# Patient Record
Sex: Male | Born: 1975 | Race: Asian | Hispanic: No | Marital: Married | Smoking: Never smoker
Health system: Southern US, Community
[De-identification: ages and names within clinical notes are randomized; demographics above are authoritative.]

## PROBLEM LIST (undated history)

## (undated) DIAGNOSIS — K219 Gastro-esophageal reflux disease without esophagitis: Secondary | ICD-10-CM

## (undated) HISTORY — PX: OTHER SURGICAL HISTORY: SHX169

## (undated) HISTORY — DX: Gastro-esophageal reflux disease without esophagitis: K21.9

---

## 2011-11-06 ENCOUNTER — Encounter: Payer: Self-pay | Admitting: Cardiology

## 2011-11-12 ENCOUNTER — Encounter: Payer: Self-pay | Admitting: *Deleted

## 2011-11-12 ENCOUNTER — Encounter: Payer: Self-pay | Admitting: Cardiology

## 2011-11-13 ENCOUNTER — Ambulatory Visit (INDEPENDENT_AMBULATORY_CARE_PROVIDER_SITE_OTHER): Payer: BC Managed Care – PPO | Admitting: Cardiology

## 2011-11-13 ENCOUNTER — Encounter: Payer: Self-pay | Admitting: Cardiology

## 2011-11-13 DIAGNOSIS — K219 Gastro-esophageal reflux disease without esophagitis: Secondary | ICD-10-CM | POA: Insufficient documentation

## 2011-11-13 DIAGNOSIS — R079 Chest pain, unspecified: Secondary | ICD-10-CM | POA: Insufficient documentation

## 2011-11-13 NOTE — Assessment & Plan Note (Signed)
Prilosec initiated yesterday. Will continue.

## 2011-11-13 NOTE — Progress Notes (Signed)
HPI: 35 yo male with no prior cardiac history for evaluation of chest pain. For the past 5 months has had occasional epigastric and chest pain. The pain is not pleuritic, positional or exertional. It occurs after eating. It lasts 30 minutes and resolves spontaneously. It does not radiate. No associated symptoms. Patient otherwise denies dyspnea on exertion, orthopnea, PND, pedal edema, syncope or exertional chest pain. Patient was started on Prilosec yesterday. Cardiology is asked to further evaluate. Pain is described as a needle sensation.  Current Outpatient Prescriptions  Medication Sig Dispense Refill  . omeprazole (PRILOSEC) 20 MG capsule Take 1 tablet by mouth Daily.      Marland Kitchen PREVPAC combo pack as directed.      . Vitamin D, Ergocalciferol, (DRISDOL) 50000 UNITS CAPS Take 1 tablet by mouth every 7 (seven) days.         No Known Allergies  Past Medical History  Diagnosis Date  . Chest pain   . Heart burn     No past surgical history on file.  History   Social History  . Marital Status: Married    Spouse Name: N/A    Number of Children: N/A  . Years of Education: N/A   Occupational History  . Not on file.   Social History Main Topics  . Smoking status: Never Smoker   . Smokeless tobacco: Not on file  . Alcohol Use: Not on file  . Drug Use: Not on file  . Sexually Active: Not on file   Other Topics Concern  . Not on file   Social History Narrative  . No narrative on file    No family history on file.  ROS: no fevers or chills, productive cough, hemoptysis, dysphasia, odynophagia, melena, hematochezia, dysuria, hematuria, rash, seizure activity, orthopnea, PND, pedal edema, claudication. Remaining systems are negative.  Physical Exam:   Blood pressure 99/62, pulse 75, weight 185 lb (83.915 kg).  General:  Well developed/well nourished in NAD Skin warm/dry Patient not depressed No peripheral clubbing Back-normal HEENT-normal/normal eyelids Neck supple/normal  carotid upstroke bilaterally; no bruits; no JVD; no thyromegaly chest - CTA/ normal expansion CV - RRR/normal S1 and S2; no murmurs, rubs or gallops;  PMI nondisplaced Abdomen -NT/ND, no HSM, no mass, + bowel sounds, no bruit 2+ femoral pulses, no bruits Ext-no edema, chords, 2+ DP Neuro-grossly nonfocal  ECG  Normal sinus rhythm with no ST changes.

## 2011-11-13 NOTE — Patient Instructions (Signed)
Your physician recommends that you schedule a follow-up appointment in: AS NEEDED  Your physician recommends that you continue on your current medications as directed. Please refer to the Current Medication list given to you today.  

## 2011-11-13 NOTE — Assessment & Plan Note (Signed)
Patient's chest pain is most consistent with GI etiology. It occurs only after eating. It is not exertional. Continue Prilosec. If symptoms persist will consider treadmill early future.

## 2011-11-27 ENCOUNTER — Ambulatory Visit
Admission: RE | Admit: 2011-11-27 | Discharge: 2011-11-27 | Disposition: A | Discharge status: Home / Self Care | Payer: BC Managed Care – PPO | Source: Ambulatory Visit | Attending: Allergy | Admitting: Allergy

## 2011-11-27 ENCOUNTER — Other Ambulatory Visit: Payer: Self-pay | Admitting: Allergy

## 2011-11-27 DIAGNOSIS — R05 Cough: Secondary | ICD-10-CM

## 2014-01-27 ENCOUNTER — Emergency Department (HOSPITAL_COMMUNITY): Payer: BC Managed Care – PPO

## 2014-01-27 ENCOUNTER — Emergency Department (HOSPITAL_COMMUNITY)
Admission: EM | Admit: 2014-01-27 | Discharge: 2014-01-27 | Disposition: A | Discharge status: Home / Self Care | Payer: BC Managed Care – PPO | Attending: Emergency Medicine | Admitting: Emergency Medicine

## 2014-01-27 ENCOUNTER — Encounter (HOSPITAL_COMMUNITY): Payer: Self-pay | Admitting: Emergency Medicine

## 2014-01-27 DIAGNOSIS — Z8719 Personal history of other diseases of the digestive system: Secondary | ICD-10-CM | POA: Insufficient documentation

## 2014-01-27 DIAGNOSIS — R52 Pain, unspecified: Secondary | ICD-10-CM | POA: Insufficient documentation

## 2014-01-27 DIAGNOSIS — R109 Unspecified abdominal pain: Secondary | ICD-10-CM

## 2014-01-27 DIAGNOSIS — R1031 Right lower quadrant pain: Secondary | ICD-10-CM | POA: Insufficient documentation

## 2014-01-27 LAB — CBC WITH DIFFERENTIAL/PLATELET
BASOS PCT: 0 % (ref 0–1)
Basophils Absolute: 0 10*3/uL (ref 0.0–0.1)
EOS ABS: 0.1 10*3/uL (ref 0.0–0.7)
Eosinophils Relative: 1 % (ref 0–5)
HEMATOCRIT: 41.2 % (ref 39.0–52.0)
HEMOGLOBIN: 14.4 g/dL (ref 13.0–17.0)
Lymphocytes Relative: 36 % (ref 12–46)
Lymphs Abs: 1.7 10*3/uL (ref 0.7–4.0)
MCH: 32.3 pg (ref 26.0–34.0)
MCHC: 35 g/dL (ref 30.0–36.0)
MCV: 92.4 fL (ref 78.0–100.0)
MONO ABS: 0.4 10*3/uL (ref 0.1–1.0)
MONOS PCT: 8 % (ref 3–12)
NEUTROS PCT: 55 % (ref 43–77)
Neutro Abs: 2.6 10*3/uL (ref 1.7–7.7)
Platelets: 202 10*3/uL (ref 150–400)
RBC: 4.46 MIL/uL (ref 4.22–5.81)
RDW: 11.7 % (ref 11.5–15.5)
WBC: 4.7 10*3/uL (ref 4.0–10.5)

## 2014-01-27 LAB — COMPREHENSIVE METABOLIC PANEL
ALBUMIN: 4 g/dL (ref 3.5–5.2)
ALT: 15 U/L (ref 0–53)
AST: 18 U/L (ref 0–37)
Alkaline Phosphatase: 87 U/L (ref 39–117)
BUN: 14 mg/dL (ref 6–23)
CALCIUM: 9.1 mg/dL (ref 8.4–10.5)
CO2: 30 mEq/L (ref 19–32)
CREATININE: 1.03 mg/dL (ref 0.50–1.35)
Chloride: 102 mEq/L (ref 96–112)
GFR calc Af Amer: >90 mL/min (ref >90)
GFR calc non Af Amer: 89 mL/min — ABNORMAL LOW (ref >90)
Glucose, Bld: 101 mg/dL — ABNORMAL HIGH (ref 70–99)
Potassium: 3.9 mEq/L (ref 3.7–5.3)
Sodium: 141 mEq/L (ref 137–147)
TOTAL PROTEIN: 7.5 g/dL (ref 6.0–8.3)
Total Bilirubin: 0.6 mg/dL (ref 0.3–1.2)

## 2014-01-27 LAB — URINALYSIS, ROUTINE W REFLEX MICROSCOPIC
Bilirubin Urine: NEGATIVE
GLUCOSE, UA: NEGATIVE mg/dL
Hgb urine dipstick: NEGATIVE
Ketones, ur: NEGATIVE mg/dL
LEUKOCYTES UA: NEGATIVE
Nitrite: NEGATIVE
PH: 5.5 (ref 5.0–8.0)
PROTEIN: NEGATIVE mg/dL
Specific Gravity, Urine: 1.025 (ref 1.005–1.030)
Urobilinogen, UA: 0.2 mg/dL (ref 0.0–1.0)

## 2014-01-27 LAB — LIPASE, BLOOD: Lipase: 45 U/L (ref 11–59)

## 2014-01-27 MED ORDER — SODIUM CHLORIDE 0.9 % IV BOLUS (SEPSIS)
1000.0000 mL | Freq: Once | INTRAVENOUS | Status: AC
  Administered 2014-01-27: 1000 mL via INTRAVENOUS

## 2014-01-27 MED ORDER — HYDROCODONE-ACETAMINOPHEN 5-325 MG PO TABS: 1.0000 | ORAL_TABLET | Freq: Four times a day (QID) | ORAL | Status: AC | PRN

## 2014-01-27 MED ORDER — ONDANSETRON HCL 4 MG/2ML IJ SOLN
4.0000 mg | Freq: Once | INTRAMUSCULAR | Status: AC
  Administered 2014-01-27: 4 mg via INTRAVENOUS
  Filled 2014-01-27: qty 2

## 2014-01-27 MED ORDER — MORPHINE SULFATE 4 MG/ML IJ SOLN
4.0000 mg | Freq: Once | INTRAMUSCULAR | Status: AC
  Administered 2014-01-27: 4 mg via INTRAVENOUS
  Filled 2014-01-27: qty 1

## 2014-01-27 MED ORDER — IOHEXOL 300 MG/ML  SOLN
100.0000 mL | Freq: Once | INTRAMUSCULAR | Status: AC | PRN
  Administered 2014-01-27: 100 mL via INTRAVENOUS

## 2014-01-27 MED ORDER — IOHEXOL 300 MG/ML  SOLN
25.0000 mL | INTRAMUSCULAR | Status: AC
  Administered 2014-01-27: 25 mL via ORAL

## 2014-01-27 NOTE — ED Provider Notes (Signed)
CSN: [id number]     Arrival date & time 01/27/14  4098 History   First MD Initiated Contact with Patient 01/27/14 [phone]     Chief Complaint  Patient presents with  . Abdominal Pain     (Consider location/radiation/quality/duration/timing/severity/associated sxs/prior Treatment) HPI  This 38 year old male with no significant past medical history who presents with right lower quadrant pain. Patient reports onset of pain approximately one week. He reports acute worsening of pain this morning. He rates his pain a 10 out of 10. He states that it is in his right lower quadrant. It is sharp in nature and nonradiating. He denies any hematuria, nausea, vomiting, or diarrhea. He denies any fevers. He has no history of kidney stones. Nothing makes the pain better or worse. He has not taken anything at home for the pain. He denies any fevers.  Past Medical History  Diagnosis Date  . GERD (gastroesophageal reflux disease)    Past Surgical History  Procedure Laterality Date  . Left arm surgery     No family history on file. History  Substance Use Topics  . Smoking status: Never Smoker   . Smokeless tobacco: Not on file  . Alcohol Use: Yes     Comment: Occasional    Review of Systems  Constitutional: Negative.  Negative for fever.  Respiratory: Negative.  Negative for chest tightness and shortness of breath.   Cardiovascular: Negative.  Negative for chest pain.  Gastrointestinal: Positive for abdominal pain. Negative for nausea, vomiting, diarrhea and blood in stool.  Genitourinary: Negative.  Negative for dysuria, hematuria, flank pain and penile pain.  Musculoskeletal: Negative for back pain.  Skin: Negative for rash.  Neurological: Negative for headaches.  All other systems reviewed and are negative.      Allergies  Review of patient's allergies indicates no known allergies.  Home Medications   Current Outpatient Rx  Name  Route  Sig  Dispense  Refill  .  HYDROcodone-acetaminophen (NORCO/VICODIN) 5-325 MG per tablet   Oral   Take 1 tablet by mouth every 6 (six) hours as needed.   15 tablet   0    BP 109/80  Pulse 66  Temp(Src) 98.5 F (36.9 C) (Oral)  Resp 18  SpO2 100% Physical Exam  Nursing note and vitals reviewed. Constitutional: He is oriented to person, place, and time. He appears well-developed and well-nourished. No distress.  HENT:  Head: Normocephalic and atraumatic.  Eyes: Pupils are equal, round, and reactive to light.  Neck: Neck supple.  Cardiovascular: Normal rate, regular rhythm and normal heart sounds.   No murmur heard. Pulmonary/Chest: Effort normal and breath sounds normal. No respiratory distress. He has no wheezes.  Abdominal: Soft. Bowel sounds are normal. He exhibits no distension. There is no tenderness. There is no rebound and no guarding.  Musculoskeletal: He exhibits no edema.  Lymphadenopathy:    He has no cervical adenopathy.  Neurological: He is alert and oriented to person, place, and time.  Skin: Skin is warm and dry.  Psychiatric: He has a normal mood and affect.    ED Course  Procedures (including critical care time) Labs Review Labs Reviewed  COMPREHENSIVE METABOLIC PANEL - Abnormal; Notable for the following:    Glucose, Bld 101 (*)    GFR calc non Af Amer 89 (*)    All other components within normal limits  CBC WITH DIFFERENTIAL  URINALYSIS, ROUTINE W REFLEX MICROSCOPIC  LIPASE, BLOOD   Imaging Review Ct Abdomen Pelvis W Contrast  01/27/2014  CLINICAL DATA:  Right lower quadrant pain  EXAM: CT ABDOMEN AND PELVIS WITH CONTRAST  TECHNIQUE: Multidetector CT imaging of the abdomen and pelvis was performed using the standard protocol following bolus administration of intravenous contrast.  CONTRAST:  100mL OMNIPAQUE IOHEXOL 300 MG/ML  SOLN  COMPARISON:  None.  FINDINGS: Dependent changes noted in the lung bases.  No suspicious liver abnormalities. The gallbladder appears normal. No  biliary dilatation. Normal appearance of the pancreas. The spleen is on unremarkable.  The adrenal glands both appear normal. Normal appearance of both kidneys. The urinary bladder is normal. Prostate gland and seminal vesicles are unremarkable.  Normal caliber of the abdominal aorta. There is no aneurysm. No upper abdominal adenopathy identified. There is no pelvic or inguinal adenopathy noted.  The stomach is normal. The small bowel loops have a normal course and caliber. No evidence for bowel obstruction. The appendix is visualized and appears normal. Normal appearance of the colon.  No free fluid or abnormal fluid collections identified within the abdomen or pelvis.  Review of the visualized bony structures is on unremarkable. No aggressive lytic or sclerotic bone lesions.  IMPRESSION: 1. No acute findings. 2. The appendix is visualized and appears normal.   Electronically Signed   By: Signa Kellaylor  Stroud M.D.   On: 01/27/2014 14:01    EKG Interpretation   None       MDM   Final diagnoses:  Abdominal pain    Patient presents with abdominal pain.  Nontoxic on exam and afebrile.  No signs of peritonitis.  Differential diagnosis includes kidney stone and appendicitis.  Labwork reassuring including LFTs.  CT scan neg for acute process.  Patient was able to tolerate PO prior to discharge.  Will follow-up with PCP.  After history, exam, and medical workup I feel the patient has been appropriately medically screened and is safe for discharge home. Pertinent diagnoses were discussed with the patient. Patient was given return precautions.    Shon Batonourtney F Ravonda Brecheen, MD 01/27/14 [phone]

## 2014-01-27 NOTE — Discharge Instructions (Signed)
Abdominal Pain, Adult You were seen today and your work-up is neg including CT scan.  You should follow a bland diet and follow-up with your PCP.  See return precautions below.  Many things can cause abdominal pain. Usually, abdominal pain is not caused by a disease and will improve without treatment. It can often be observed and treated at home. Your health care provider will do a physical exam and possibly order blood tests and X-rays to help determine the seriousness of your pain. However, in many cases, more time must pass before a clear cause of the pain can be found. Before that point, your health care provider may not know if you need more testing or further treatment. HOME CARE INSTRUCTIONS  Monitor your abdominal pain for any changes. The following actions may help to alleviate any discomfort you are experiencing:  Only take over-the-counter or prescription medicines as directed by your health care provider.  Do not take laxatives unless directed to do so by your health care provider.  Try a clear liquid diet (broth, tea, or water) as directed by your health care provider. Slowly move to a bland diet as tolerated. SEEK MEDICAL CARE IF:  You have unexplained abdominal pain.  You have abdominal pain associated with nausea or diarrhea.  You have pain when you urinate or have a bowel movement.  You experience abdominal pain that wakes you in the night.  You have abdominal pain that is worsened or improved by eating food.  You have abdominal pain that is worsened with eating fatty foods. SEEK IMMEDIATE MEDICAL CARE IF:   Your pain does not go away within 2 hours.  You have a fever.  You keep throwing up (vomiting).  Your pain is felt only in portions of the abdomen, such as the right side or the left lower portion of the abdomen.  You pass bloody or black tarry stools. MAKE SURE YOU:  Understand these instructions.   Will watch your condition.   Will get help right  away if you are not doing well or get worse.  Document Released: 09/02/2005 Document Revised: 09/13/2013 Document Reviewed: 08/02/2013 [facility] CenterExitCare Patient Information 2014 [place], [place].

## 2014-01-27 NOTE — ED Notes (Signed)
PT reports RLQ pain on and off for 1 week . Pain is worse now and more frequent starting this AM. Pain is a 10/10. Pt denies N/V.

## 2014-01-27 NOTE — ED Notes (Signed)
Ambulated pt. To the restroom. 

## 2015-08-13 IMAGING — CT CT ABD-PELV W/ CM
2 of 5 series · 17 of 46 positions shown, 19 images · IV contrast (omnipaque)
Comparison: None.

CLINICAL DATA: Right lower quadrant pain

EXAM:
CT ABDOMEN AND PELVIS WITH CONTRAST
TECHNIQUE: Multidetector CT imaging of the abdomen and pelvis was performed
using the standard protocol following bolus administration of
intravenous contrast.
CONTRAST:  100mL OMNIPAQUE IOHEXOL 300 MG/ML  SOLN

[Series 2: abd/ pelvis 5.0 i30f 1 · axial · 0.78mm/px · z∈[-997,-542]mm · 14 of 103 slices shown, 16 images]
[im 6/103  soft-tissue]
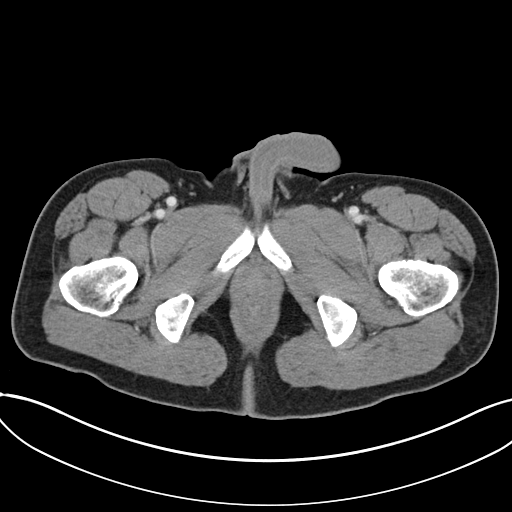
[im 6/103  bone]
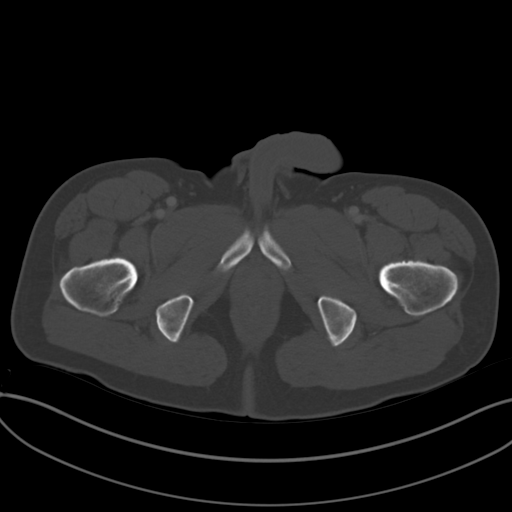
[im 11/103  soft-tissue]
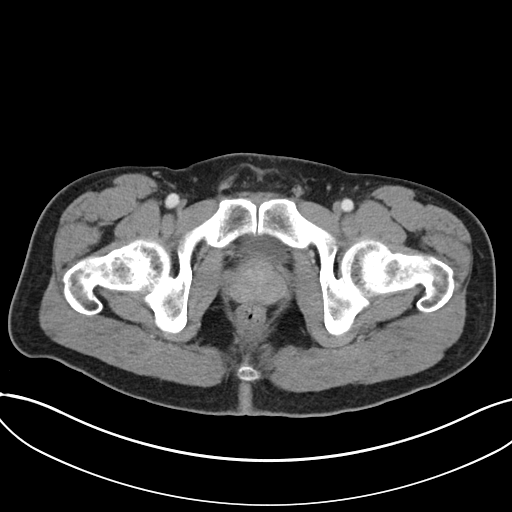
[im 22/103  soft-tissue]
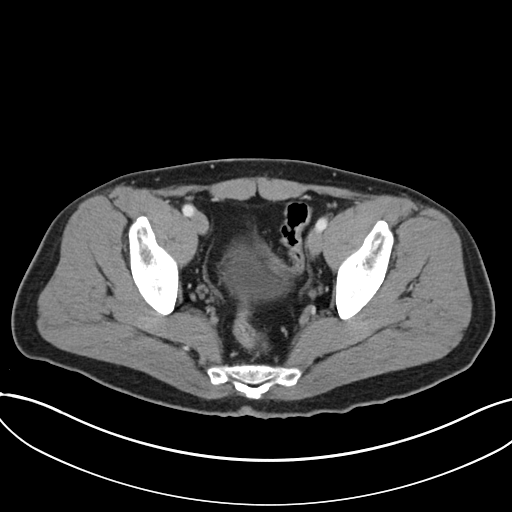
[im 27/103  soft-tissue]
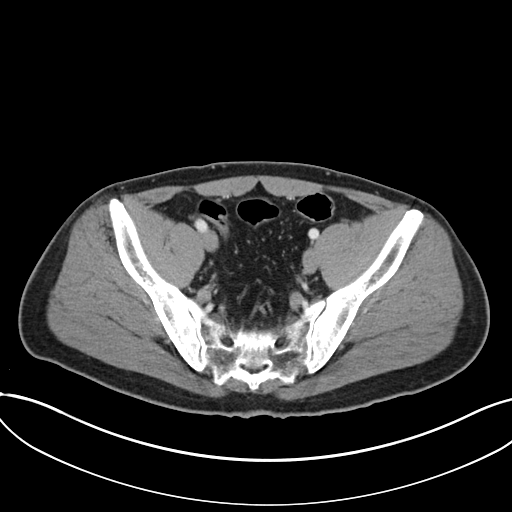
[im 33/103  soft-tissue]
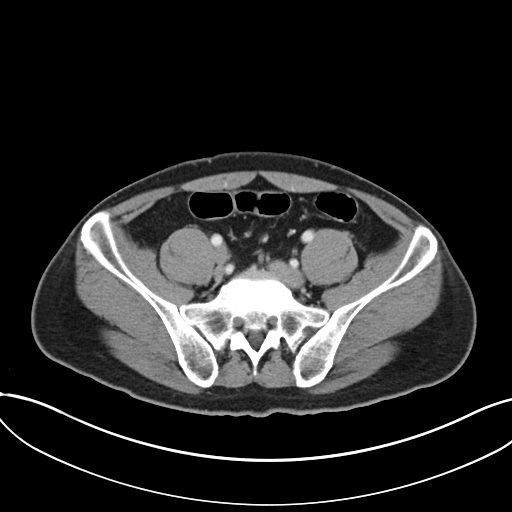
[im 43/103  soft-tissue]
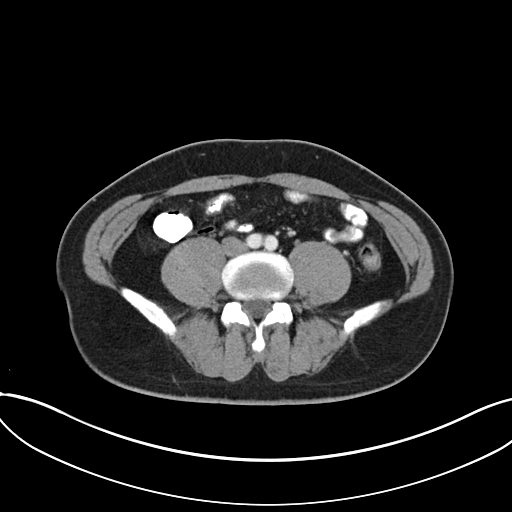
[im 49/103  soft-tissue]
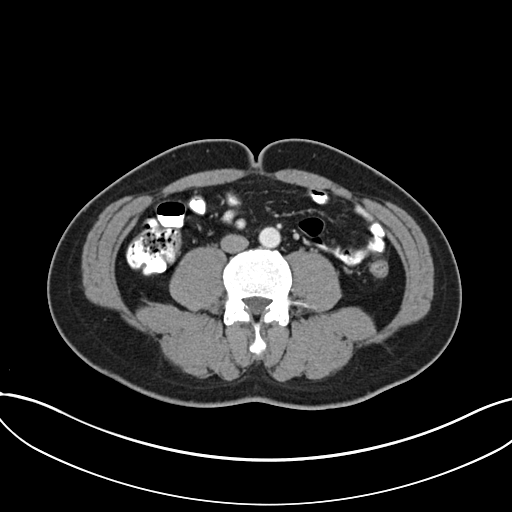
[im 54/103  soft-tissue]
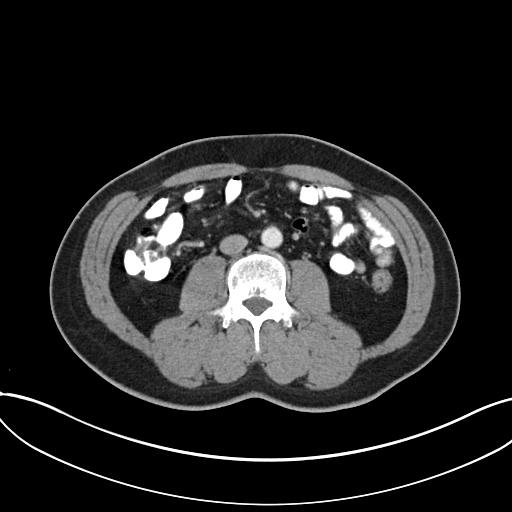
[im 60/103  soft-tissue]
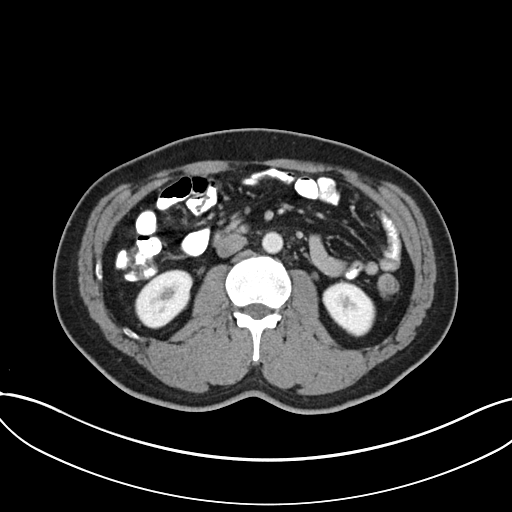
[im 60/103  bone]
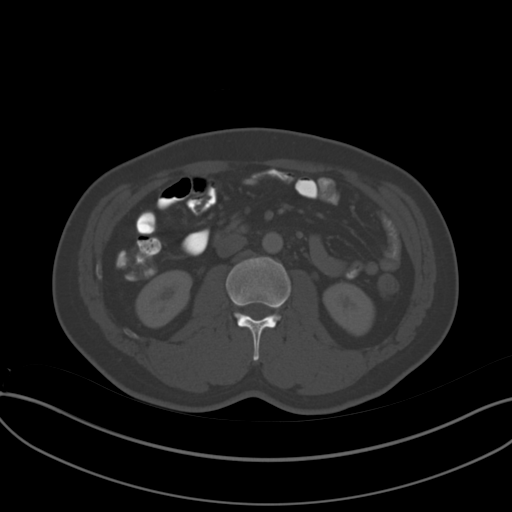
[im 70/103  soft-tissue]
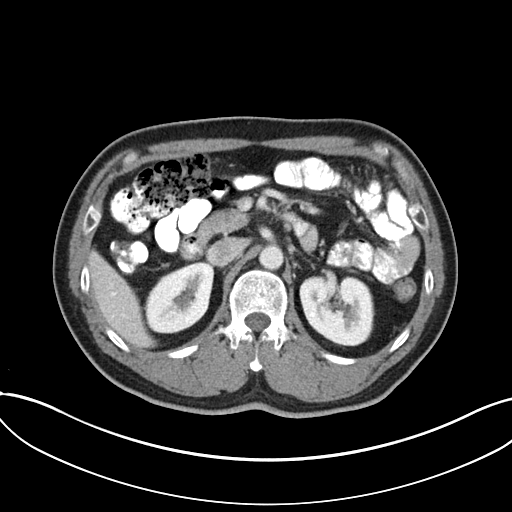
[im 76/103  soft-tissue]
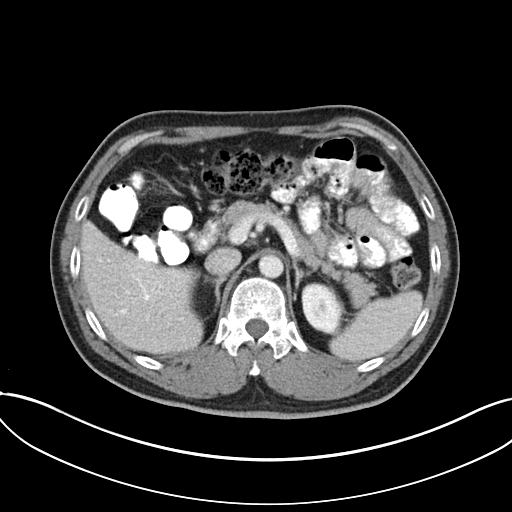
[im 81/103  soft-tissue]
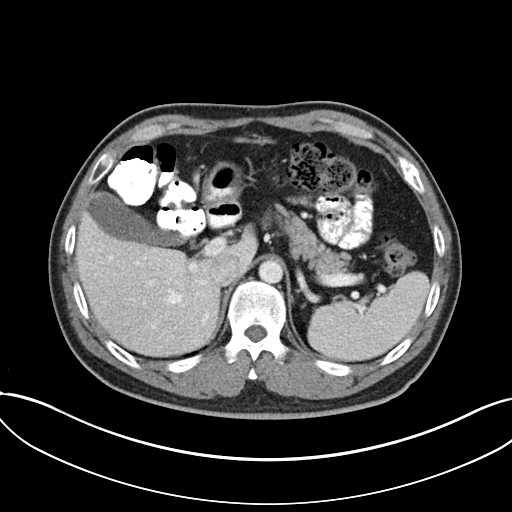
[im 92/103  soft-tissue]
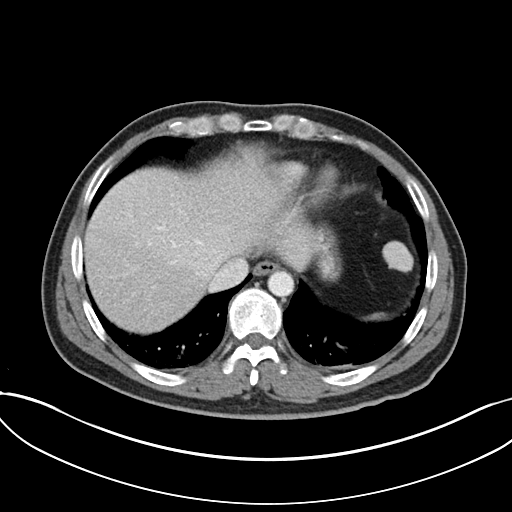
[im 97/103  soft-tissue]
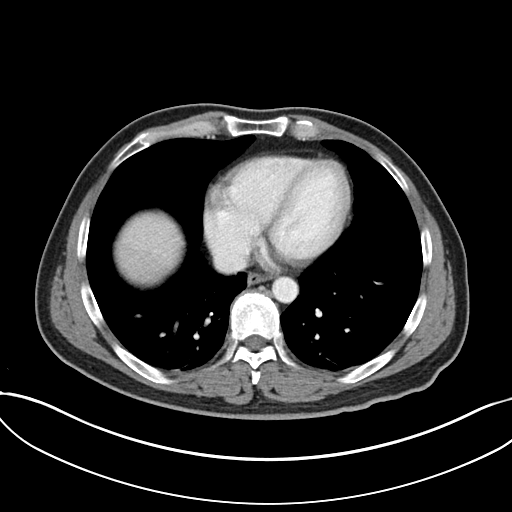

[Series 5: coronal soft tissue · coronal · 0.86mm/px · 3 of 78 slices shown]
[im 26/78  soft-tissue]
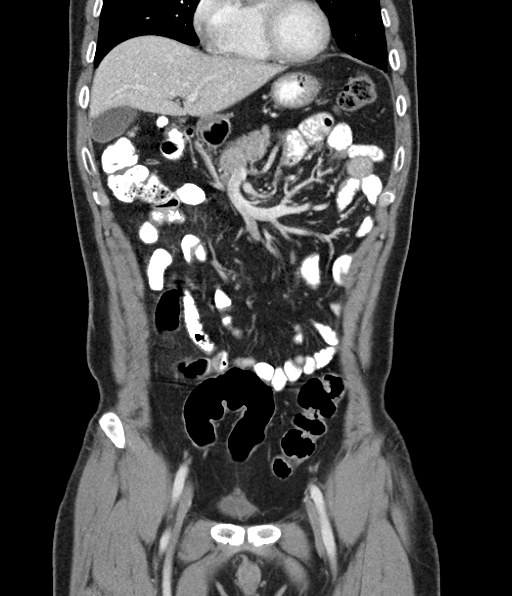
[im 35/78  soft-tissue]
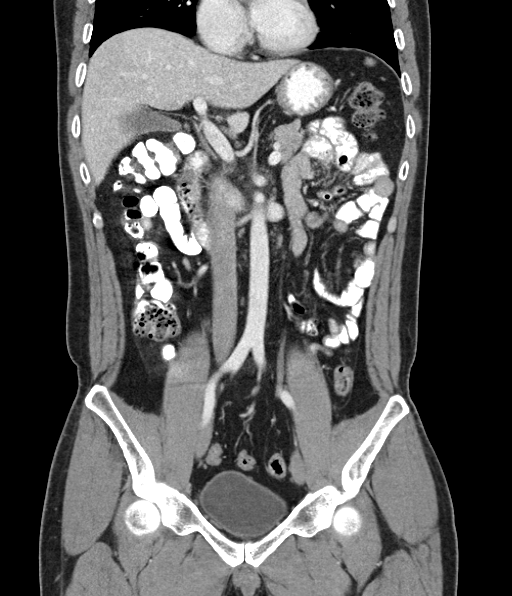
[im 43/78  soft-tissue]
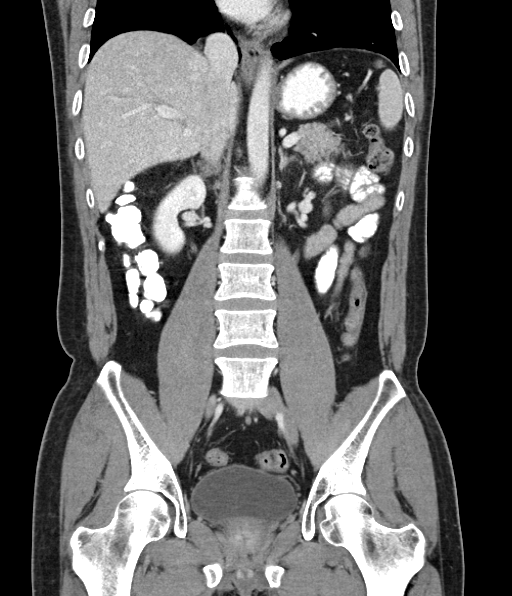

[17 of 46 positions shown; findings below may reference images not displayed]

FINDINGS: Dependent changes noted in the lung bases.

No suspicious liver abnormalities. The gallbladder appears normal.
No biliary dilatation. Normal appearance of the pancreas. The spleen
is on unremarkable.

The adrenal glands both appear normal. Normal appearance of both
kidneys. The urinary bladder is normal. Prostate gland and seminal
vesicles are unremarkable.

Normal caliber of the abdominal aorta. There is no aneurysm. No
upper abdominal adenopathy identified. There is no pelvic or
inguinal adenopathy noted.

The stomach is normal. The small bowel loops have a normal course
and caliber. No evidence for bowel obstruction. The appendix is
visualized and appears normal. Normal appearance of the colon.

No free fluid or abnormal fluid collections identified within the
abdomen or pelvis.

Review of the visualized bony structures is on unremarkable. No
aggressive lytic or sclerotic bone lesions.
IMPRESSION: 1. No acute findings.
2. The appendix is visualized and appears normal.
